# Patient Record
Sex: Female | Born: 2005
Health system: Southern US, Community
[De-identification: ages and names within clinical notes are randomized; demographics above are authoritative.]

## PROBLEM LIST (undated history)

## (undated) DIAGNOSIS — N83209 Unspecified ovarian cyst, unspecified side: Secondary | ICD-10-CM

## (undated) DIAGNOSIS — J45909 Unspecified asthma, uncomplicated: Secondary | ICD-10-CM

## (undated) DIAGNOSIS — F909 Attention-deficit hyperactivity disorder, unspecified type: Secondary | ICD-10-CM

## (undated) DIAGNOSIS — K5900 Constipation, unspecified: Secondary | ICD-10-CM

## (undated) HISTORY — PX: NO PAST SURGERIES: SHX2092

---
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
## 2025-01-19 ENCOUNTER — Emergency Department (HOSPITAL_BASED_OUTPATIENT_CLINIC_OR_DEPARTMENT_OTHER)

## 2025-01-19 ENCOUNTER — Encounter (HOSPITAL_BASED_OUTPATIENT_CLINIC_OR_DEPARTMENT_OTHER): Payer: Self-pay | Admitting: Emergency Medicine

## 2025-01-19 ENCOUNTER — Emergency Department (HOSPITAL_BASED_OUTPATIENT_CLINIC_OR_DEPARTMENT_OTHER)
Admission: EM | Admit: 2025-01-19 | Discharge: 2025-01-19 | Disposition: A | Discharge status: Home / Self Care | Attending: Emergency Medicine | Admitting: Emergency Medicine

## 2025-01-19 DIAGNOSIS — J069 Acute upper respiratory infection, unspecified: Secondary | ICD-10-CM | POA: Insufficient documentation

## 2025-01-19 DIAGNOSIS — J45901 Unspecified asthma with (acute) exacerbation: Secondary | ICD-10-CM | POA: Insufficient documentation

## 2025-01-19 DIAGNOSIS — J4521 Mild intermittent asthma with (acute) exacerbation: Secondary | ICD-10-CM

## 2025-01-19 HISTORY — DX: Unspecified asthma, uncomplicated: J45.909

## 2025-01-19 LAB — RESP PANEL BY RT-PCR (RSV, FLU A&B, COVID)  RVPGX2
Influenza A by PCR: NEGATIVE
Influenza B by PCR: NEGATIVE
Resp Syncytial Virus by PCR: NEGATIVE
SARS Coronavirus 2 by RT PCR: NEGATIVE

## 2025-01-19 LAB — GROUP A STREP BY PCR: Group A Strep by PCR: NOT DETECTED

## 2025-01-19 MED ORDER — ALBUTEROL SULFATE (2.5 MG/3ML) 0.083% IN NEBU
2.5000 mg | INHALATION_SOLUTION | Freq: Once | RESPIRATORY_TRACT | Status: AC
  Administered 2025-01-19: 2.5 mg via RESPIRATORY_TRACT
  Filled 2025-01-19: qty 3

## 2025-01-19 MED ORDER — METHYLPREDNISOLONE SODIUM SUCC 125 MG IJ SOLR
125.0000 mg | Freq: Once | INTRAMUSCULAR | Status: AC
  Administered 2025-01-19: 125 mg via INTRAVENOUS
  Filled 2025-01-19: qty 2

## 2025-01-19 MED ORDER — LACTATED RINGERS IV BOLUS
1000.0000 mL | Freq: Once | INTRAVENOUS | Status: AC
  Administered 2025-01-19: 1000 mL via INTRAVENOUS

## 2025-01-19 MED ORDER — IPRATROPIUM-ALBUTEROL 0.5-2.5 (3) MG/3ML IN SOLN
3.0000 mL | Freq: Once | RESPIRATORY_TRACT | Status: AC
  Administered 2025-01-19: 3 mL via RESPIRATORY_TRACT
  Filled 2025-01-19: qty 3

## 2025-01-19 MED ORDER — PREDNISONE 10 MG PO TABS: 40.0000 mg | ORAL_TABLET | Freq: Every day | ORAL | 0 refills | Status: AC

## 2025-01-19 MED ORDER — FLUCONAZOLE 200 MG PO TABS: 200.0000 mg | ORAL_TABLET | Freq: Once | ORAL | 0 refills | Status: AC

## 2025-01-19 NOTE — ED Provider Notes (Signed)
 " [facility] Provider Note   CSN: [id number] Arrival date & time: 01/19/25  8147     Patient presents with: Shortness of Breath   Joyce [name] is a 19 y.o. female with history of asthma who presents with concern for dry cough, sore throat, wheezing, congestion, and feelings of shortness of breath at home that began a couple days ago.  Reports fever but no chills at home. No abdominal pain, nausea, vomiting.  Reports she has been taking her home albuterol  inhaler without relief of symptoms.  Reports that she recently got over pneumonia at the beginning of the month and is concerned that she may have another one.  She reports that she did complete the full course of antibiotics for her previous pneumonia.     Shortness of Breath      Prior to Admission medications  Medication Sig Start Date End Date Taking? Authorizing Provider  fluconazole  (DIFLUCAN ) 200 MG tablet Take 1 tablet (200 mg total) by mouth once for 1 dose. If you develop symptoms of a yeast infection 01/19/25 01/19/25 Yes Veta Palma, PA-C  predniSONE  (DELTASONE ) 10 MG tablet Take 4 tablets (40 mg total) by mouth daily with breakfast for 4 days. 01/20/25 01/24/25 Yes Veta Palma, PA-C  albuterol  (VENTOLIN  HFA) 108 (90 Base) MCG/ACT inhaler Inhale 1-2 puffs into the lungs every 6 (six) hours as needed for wheezing or shortness of breath. 12/19/24   Mecum, Erin E, PA-C  azithromycin  (ZITHROMAX  Z-PAK) 250 MG tablet Take 2 tablets on day 1 followed by 1 tablet for the following 4 days 12/19/24   Smoot, Lauraine LABOR, PA-C  ibuprofen  (ADVIL ) 600 MG tablet Take 1 tablet (600 mg total) by mouth every 6 (six) hours as needed for fever or moderate pain (pain score 4-6). 05/16/24   Lang Maxwell, NP  norgestimate -ethinyl estradiol  (ORTHO-CYCLEN) 0.25-35 MG-MCG tablet Take 1 tablet by mouth daily. 12/22/22   Connell Davies, MD    Allergies: Patient has no known allergies.    Review of Systems   Respiratory:  Positive for shortness of breath.     Updated Vital Signs BP (!) 145/57   Pulse (!) 108   Temp 100 F (37.8 C)   Resp (!) 25   LMP 01/14/2025   SpO2 99%   Physical Exam Vitals and nursing note reviewed.  Constitutional:      General: She is not in acute distress.    Appearance: She is well-developed.  HENT:     Head: Normocephalic and atraumatic.     Mouth/Throat:     Pharynx: No oropharyngeal exudate or posterior oropharyngeal erythema.     Comments: Swallowing without difficulty, no trismus.  No skin change of the neck.  Neck is soft and supple.  No peritonsillar abscess Eyes:     Conjunctiva/sclera: Conjunctivae normal.  Cardiovascular:     Rate and Rhythm: Normal rate and regular rhythm.     Heart sounds: No murmur heard. Pulmonary:     Effort: Pulmonary effort is normal. No respiratory distress.     Comments: Expiratory wheezing diffusely throughout lung fields.  Talking in full sentences on room air without difficulty Coughing on exam Abdominal:     Palpations: Abdomen is soft.     Tenderness: There is no abdominal tenderness.  Musculoskeletal:        General: No swelling.     Cervical back: Neck supple. No rigidity.  Skin:    General: Skin is warm and dry.  Capillary Refill: Capillary refill takes less than 2 seconds.  Neurological:     Mental Status: She is alert.  Psychiatric:        Mood and Affect: Mood normal.     (all labs ordered are listed, but only abnormal results are displayed) Labs Reviewed  RESP PANEL BY RT-PCR (RSV, FLU A&B, COVID)  [id number]  GROUP A STREP BY PCR    EKG: None  Radiology: DG Chest 2 View Result Date: 01/19/2025 EXAM: 2 VIEW(S) XRAY OF THE CHEST 01/19/2025 09:38:00 PM COMPARISON: 04/15/2024 CLINICAL HISTORY: Cough. FINDINGS: LUNGS AND PLEURA: No focal pulmonary opacity. No pleural effusion. No pneumothorax. HEART AND MEDIASTINUM: No acute abnormality of the cardiac and mediastinal silhouettes. BONES AND SOFT  TISSUES: No acute osseous abnormality. IMPRESSION: 1. No acute process. Electronically signed by: Greig Pique MD 01/19/2025 09:49 PM EST RP Workstation: [id number]     Procedures   Medications Ordered in the ED  ipratropium-albuterol  (DUONEB) 0.5-2.5 (3) MG/3ML nebulizer solution 3 mL (3 mLs Nebulization Given 01/19/25 1921)  albuterol  (PROVENTIL ) (2.5 MG/3ML) 0.083% nebulizer solution 2.5 mg (2.5 mg Nebulization Given 01/19/25 1921)  ipratropium-albuterol  (DUONEB) 0.5-2.5 (3) MG/3ML nebulizer solution 3 mL (3 mLs Nebulization Given 01/19/25 2116)  methylPREDNISolone  sodium succinate (SOLU-MEDROL ) 125 mg/2 mL injection 125 mg (125 mg Intravenous Given 01/19/25 2153)  lactated ringers  bolus 1,000 mL (0 mLs Intravenous Stopped 01/19/25 2304)    Clinical Course as of 01/19/25 2307  Sun Jan 19, 2025  2212 Patient reevaluated, feeling better after second DuoNeb and Solu-Medrol .  Still some wheezing on exam but maintaining at 100% oxygen saturation on room air and talking in full sentences without difficulty [AF]    Clinical Course User Index [AF] Veta Palma, PA-C                                 Medical Decision Making Amount and/or Complexity of Data Reviewed Radiology: ordered.  Risk Prescription drug management.     Differential diagnosis includes but is not limited to asthma exacerbation, COVID, flu, RSV, viral URI, strep pharyngitis, viral pharyngitis, allergic rhinitis, pneumonia, bronchitis   ED Course:  Upon initial evaluation, patient is well-appearing, no acute distress.  Patient with expiratory wheezing bilaterally on exam.  However, no respiratory distress.  She is talking in full sentences on room air without difficulty.  Maintaining at 100% oxygen saturation on room air.  Labs Ordered: I Ordered, and personally interpreted labs.  The pertinent results include:   COVID, flu, RSV, strep negative  Imaging Studies ordered: I ordered imaging studies including chest  x-ray I independently visualized the imaging with scope of interpretation limited to determining acute life threatening conditions related to emergency care. Imaging showed no acute abnormality I agree with the radiologist interpretation  Medications Given: Methylprednisolone  LR bolus DuoNeb x 2  Upon re-evaluation, patient reports she feels less short of breath with the Solu-Medrol  and breathing treatments.  Wheezing has improved, but not completely resolved.  Still slightly tachycardic, likely from viral URI and albuterol  treatments.  Suspect that she has an asthma exacerbation secondary to a viral URI.  Her symptoms of cough, congestion are consistent with viral URI.  Her COVID, flu and RSV testing is negative.  Strep testing negative.  Chest x-ray does not show any consolidations, doubt pneumonia.  Doubt pulmonary embolism given her other symptoms of cough, congestion, more so consistent with URI and patient does not have any hypoxia.  She reports that prednisone  sometimes gives her yeast infection, will send in Diflucan  to her pharmacy as well.  Patient stable and appropriate for discharge home at this time   Impression: Viral URI Asthma exacerbation  Disposition:  Discharged home with instructions to use over-the-counter medications as needed for symptom control.  Prednisone  as prescribed.  May use albuterol  inhaler as needed for shortness of breath and wheezing.  Patient states she does not need a refill of this medication and has one at home.  Diflucan  if she develops signs of yeast infection. Follow-up with PCP if symptoms not improving within the next 5 days. Return precautions given and patient verbalized understanding.   This chart was dictated using voice recognition software, Dragon. Despite the best efforts of this provider to proofread and correct errors, errors may still occur which can change documentation meaning.       Final diagnoses:  Exacerbation of intermittent  asthma, unspecified asthma severity  URI, acute    ED Discharge Orders          Ordered    predniSONE  (DELTASONE ) 10 MG tablet  Daily with breakfast        01/19/25 2302    fluconazole  (DIFLUCAN ) 200 MG tablet   Once        01/19/25 2306               Veta Palma, PA-C 01/19/25 2308  "

## 2025-01-19 NOTE — ED Triage Notes (Signed)
 Pt reports SHOB, wheezing; using inhaler at home, but not helping; RT in to assess

## 2025-01-24 ENCOUNTER — Inpatient Hospital Stay: Admission: RE | Admit: 2025-01-24 | Discharge: 2025-01-24

## 2025-01-24 ENCOUNTER — Ambulatory Visit (INDEPENDENT_AMBULATORY_CARE_PROVIDER_SITE_OTHER)

## 2025-01-24 VITALS — BP 123/85 | HR 113 | Temp 98.7°F | Resp 20 | Wt 187.0 lb

## 2025-01-24 DIAGNOSIS — M25511 Pain in right shoulder: Secondary | ICD-10-CM

## 2025-01-24 NOTE — ED Triage Notes (Signed)
 Fell 2.5.26 and hit right shoulder. Pain 10/10. Not able to move shoulder. Tylenol , ice, and ibuprofen  doesn't help.

## 2025-01-24 NOTE — Discharge Instructions (Signed)
 Wear the sling as directed.  Take Tylenol  or ibuprofen  as directed.  Rest your shoulder.    Follow-up with an orthopedist such as the one listed below.

## 2025-01-24 NOTE — ED Provider Notes (Signed)
 " Joyce [name]    CSN: [id number] Arrival date & time: 01/24/25  1208      History   Chief Complaint Chief Complaint  Patient presents with   Shoulder Injury    I fell on my shoulder yesterday and can not move /lift over my head - Entered by patient    HPI Joyce [name] is a 19 y.o. female.  Patient presents with right shoulder pain after she accidentally slipped and fell on ice yesterday morning.  She has been treating her symptoms with Tylenol , ibuprofen , ice.  Her pain is worse with movement.  No open wounds, numbness, weakness.  The history is provided by the patient and medical records.    Past Medical History:  Diagnosis Date   ADHD    Asthma    Constipation    COVID-19 12/2020   vaccine x 2   Ovarian cyst     Patient Active Problem List   Diagnosis Date Noted   Obesity (BMI 30.0-34.9) 05/18/2022    Past Surgical History:  Procedure Laterality Date   NO PAST SURGERIES      OB History     Gravida  0   Para  0   Term  0   Preterm  0   AB  0   Living         SAB  0   IAB  0   Ectopic  0   Multiple      Live Births               Home Medications    Prior to Admission medications  Medication Sig Start Date End Date Taking? Authorizing Provider  albuterol  (VENTOLIN  HFA) 108 (90 Base) MCG/ACT inhaler Inhale 1-2 puffs into the lungs every 6 (six) hours as needed for wheezing or shortness of breath. 12/19/24   Mecum, Erin E, PA-C  azithromycin  (ZITHROMAX  Z-PAK) 250 MG tablet Take 2 tablets on day 1 followed by 1 tablet for the following 4 days Patient not taking: Reported on 01/24/2025 12/19/24   Smoot, Lauraine LABOR, PA-C  ibuprofen  (ADVIL ) 600 MG tablet Take 1 tablet (600 mg total) by mouth every 6 (six) hours as needed for fever or moderate pain (pain score 4-6). 05/16/24   Lang Maxwell, NP  norgestimate -ethinyl estradiol  (ORTHO-CYCLEN) 0.25-35 MG-MCG tablet Take 1 tablet by mouth daily. Patient not taking: Reported on 01/24/2025 12/22/22    Connell Davies, MD  predniSONE  (DELTASONE ) 10 MG tablet Take 4 tablets (40 mg total) by mouth daily with breakfast for 4 days. 01/20/25 01/24/25  Veta Palma, PA-C    Family History Family History  Problem Relation Age of Onset   Healthy Mother    Healthy Father     Social History Social History[1]   Allergies   Strawberry (diagnostic)   Review of Systems Review of Systems  Musculoskeletal:  Positive for arthralgias. Negative for joint swelling.  Skin:  Negative for color change, rash and wound.  Neurological:  Negative for weakness and numbness.     Physical Exam Triage Vital Signs ED Triage Vitals  Encounter Vitals Group     BP      Girls Systolic BP Percentile      Girls Diastolic BP Percentile      Boys Systolic BP Percentile      Boys Diastolic BP Percentile      Pulse      Resp      Temp      Temp src  SpO2      Weight      Height      Head Circumference      Peak Flow      Pain Score      Pain Loc      Pain Education      Exclude from Growth Chart    No data found.  Updated Vital Signs BP 123/85 (BP Location: Left Arm)   Pulse (!) 113   Temp 98.7 F (37.1 C) (Oral)   Resp 20   Wt 187 lb (84.8 kg)   LMP 01/14/2025   SpO2 96%   BMI 34.20 kg/m   Visual Acuity Right Eye Distance:   Left Eye Distance:   Bilateral Distance:    Right Eye Near:   Left Eye Near:    Bilateral Near:     Physical Exam Constitutional:      General: She is not in acute distress. HENT:     Mouth/Throat:     Mouth: Mucous membranes are moist.  Cardiovascular:     Rate and Rhythm: Normal rate.  Pulmonary:     Effort: Pulmonary effort is normal. No respiratory distress.  Musculoskeletal:        General: Tenderness present. No swelling or deformity.  Skin:    General: Skin is warm and dry.     Capillary Refill: Capillary refill takes less than 2 seconds.     Findings: No bruising, erythema, lesion or rash.  Neurological:     General: No focal  deficit present.     Mental Status: She is alert.     Sensory: No sensory deficit.     Motor: No weakness.      UC Treatments / Results  Labs (all labs ordered are listed, but only abnormal results are displayed) Labs Reviewed - No data to display  EKG   Radiology DG Shoulder Right Result Date: 01/24/2025 CLINICAL DATA:  Status post fall with trauma to the right shoulder EXAM: DG SHOULDER 3V*R* COMPARISON:  None Available. FINDINGS: There is no evidence of fracture or dislocation. There is no evidence of arthropathy or other focal bone abnormality. Soft tissues are unremarkable. IMPRESSION: No acute fracture or dislocation. Electronically Signed   By: Limin  Xu M.D.   On: 01/24/2025 13:00    Procedures Procedures (including critical care time)  Medications Ordered in UC Medications - No data to display  Initial Impression / Assessment and Plan / UC Course  I have reviewed the triage vital signs and the nursing notes.  Pertinent labs & imaging results that were available during my care of the patient were reviewed by me and considered in my medical decision making (see chart for details).   Right shoulder pain. Xray negative. Treating with sling, Tylenol  or ibuprofen , rest, ice packs.  Instructed patient to follow-up with an orthopedist.  Contact information for on-call Ortho provided.  Education provided on shoulder pain.  ED precautions given.  She agrees to plan of care.   Final Clinical Impressions(s) / UC Diagnoses   Final diagnoses:  Acute pain of right shoulder     Discharge Instructions      Wear the sling as directed.  Take Tylenol  or ibuprofen  as directed.  Rest your shoulder.    Follow-up with an orthopedist such as the one listed below.     ED Prescriptions   None    PDMP not reviewed this encounter.     [1]  Social History Tobacco Use   Smoking  status: Never    Passive exposure: Current   Smokeless tobacco: Never  Vaping Use   Vaping  status: Never Used  Substance Use Topics   Alcohol use: No   Drug use: Not Currently     Corlis Burnard DEL, NP 01/24/25 1304  "

## 2025-01-28 ENCOUNTER — Ambulatory Visit

## 2025-02-11 ENCOUNTER — Encounter: Admitting: Obstetrics and Gynecology
